# Patient Record
Sex: Male | Born: 1945 | Race: Black or African American | Hispanic: No | Marital: Married | Smoking: Never smoker
Health system: Southern US, Community
[De-identification: ages and names within clinical notes are randomized; demographics above are authoritative.]

## PROBLEM LIST (undated history)

## (undated) DIAGNOSIS — M109 Gout, unspecified: Secondary | ICD-10-CM

## (undated) DIAGNOSIS — E785 Hyperlipidemia, unspecified: Secondary | ICD-10-CM

## (undated) DIAGNOSIS — C61 Malignant neoplasm of prostate: Secondary | ICD-10-CM

## (undated) DIAGNOSIS — N19 Unspecified kidney failure: Secondary | ICD-10-CM

## (undated) DIAGNOSIS — I10 Essential (primary) hypertension: Secondary | ICD-10-CM

## (undated) HISTORY — PX: SHOULDER SURGERY: SHX246

## (undated) HISTORY — PX: PROSTATE BIOPSY: SHX241

## (undated) HISTORY — PX: KIDNEY TRANSPLANT: SHX239

---

## 1998-02-18 ENCOUNTER — Other Ambulatory Visit: Admission: RE | Admit: 1998-02-18 | Discharge: 1998-02-18 | Payer: Self-pay | Admitting: Family Medicine

## 1998-02-19 ENCOUNTER — Other Ambulatory Visit: Admission: RE | Admit: 1998-02-19 | Discharge: 1998-02-19 | Payer: Self-pay | Admitting: Family Medicine

## 1999-03-09 ENCOUNTER — Ambulatory Visit (HOSPITAL_BASED_OUTPATIENT_CLINIC_OR_DEPARTMENT_OTHER): Admission: RE | Admit: 1999-03-09 | Discharge: 1999-03-09 | Payer: Self-pay | Admitting: Ophthalmology

## 1999-11-20 ENCOUNTER — Other Ambulatory Visit: Admission: RE | Admit: 1999-11-20 | Discharge: 1999-11-20 | Payer: Self-pay | Admitting: Urology

## 2003-06-07 ENCOUNTER — Encounter: Payer: Self-pay | Admitting: Nephrology

## 2003-06-07 ENCOUNTER — Encounter: Admission: RE | Admit: 2003-06-07 | Discharge: 2003-06-07 | Payer: Self-pay | Admitting: Nephrology

## 2003-06-21 ENCOUNTER — Encounter: Payer: Self-pay | Admitting: Family Medicine

## 2003-06-21 ENCOUNTER — Ambulatory Visit (HOSPITAL_COMMUNITY): Admission: RE | Admit: 2003-06-21 | Discharge: 2003-06-21 | Payer: Self-pay | Admitting: Family Medicine

## 2005-08-27 ENCOUNTER — Encounter: Admission: RE | Admit: 2005-08-27 | Discharge: 2005-08-27 | Payer: Self-pay | Admitting: Nephrology

## 2005-11-18 ENCOUNTER — Ambulatory Visit (HOSPITAL_COMMUNITY): Admission: RE | Admit: 2005-11-18 | Discharge: 2005-11-18 | Payer: Self-pay | Admitting: Pulmonary Disease

## 2005-12-09 ENCOUNTER — Encounter: Admission: RE | Admit: 2005-12-09 | Discharge: 2005-12-09 | Payer: Self-pay | Admitting: Orthopedic Surgery

## 2006-01-06 ENCOUNTER — Ambulatory Visit (HOSPITAL_COMMUNITY): Admission: RE | Admit: 2006-01-06 | Discharge: 2006-01-07 | Payer: Self-pay | Admitting: Orthopedic Surgery

## 2006-01-26 ENCOUNTER — Encounter: Admission: RE | Admit: 2006-01-26 | Discharge: 2006-03-02 | Payer: Self-pay | Admitting: Orthopedic Surgery

## 2007-10-26 HISTORY — PX: VASECTOMY: SHX75

## 2008-04-30 ENCOUNTER — Ambulatory Visit (HOSPITAL_COMMUNITY): Admission: RE | Admit: 2008-04-30 | Discharge: 2008-04-30 | Payer: Self-pay | Admitting: Pulmonary Disease

## 2008-09-04 ENCOUNTER — Ambulatory Visit (HOSPITAL_COMMUNITY): Admission: RE | Admit: 2008-09-04 | Discharge: 2008-09-04 | Payer: Self-pay | Admitting: Urology

## 2008-10-10 ENCOUNTER — Ambulatory Visit (HOSPITAL_COMMUNITY): Admission: RE | Admit: 2008-10-10 | Discharge: 2008-10-10 | Payer: Self-pay | Admitting: Urology

## 2008-11-01 ENCOUNTER — Ambulatory Visit: Admission: RE | Admit: 2008-11-01 | Discharge: 2009-01-30 | Payer: Self-pay | Admitting: Radiation Oncology

## 2010-11-15 ENCOUNTER — Encounter: Payer: Self-pay | Admitting: Urology

## 2011-03-12 NOTE — Op Note (Signed)
NAME:  Wyatt Austin, Wyatt Austin NO.:  [id number]   MEDICAL RECORD NO.:  [id number]          PATIENT TYPE:  OIB   LOCATION:  5003                         FACILITY:  [facility]   PHYSICIAN:  Myrtie Neither, MD      DATE OF BIRTH:  [birth year]   DATE OF PROCEDURE:  01/06/2006  DATE OF DISCHARGE:  01/07/2006                                 OPERATIVE REPORT   PREOPERATIVE DIAGNOSIS:  Impingement syndrome and subacromial bursitis, left  shoulder.   POSTOPERATIVE DIAGNOSIS:  Impingement syndrome and subacromial bursitis,  left shoulder.   SURGEON:  Myrtie Neither, MD   ANESTHESIA:  General.   PROCEDURE:  Arthroscopic acromioplasty and decompression synovectomy, left  shoulder.   DESCRIPTION OF PROCEDURE:  The patient was taken to the operating room after  giving adequate preop medication and given general anesthesia and intubated.  The patient had a scalene block given.  The patient was placed in the barber-  chair position and left shoulder was prepped with DuraPrep and draped in a  sterile manner.  A 1/2-inch puncture wound was made posterior.  A Swisher  rod was placed posterior-to-anterior and anterior inflow of water.  Lateral  arthroscopic shaver incision was made.  Inspection of the joint revealed  tremendously hypertrophic subacromial bursa sac with obvious tendinopathy  against the rotator cuff and chondromalacic changes of the subacromial  surface.  The synovial sac was completely resected followed by acromioplasty  and decompression of the coracohumeral ligament with the use of the shaver.  The cuff tendon was found to have been impinged upon with obvious thickened  surface area with some fibers being penetrated, but not through and through.  After adequate decompression and debridement, wound closure was then done  with 4-0 nylon.  Compressive dressing was applied.  The patient was placed  in an immobilizing sling.  The patient is being kept for 23-hour observation  for  pain control.  The patient tolerated the procedure quite well and will  be discharged in a.m. on Percocet one to two q.4 h. p.r.n. for pain, ice  packs and continued use of immobilizing sling.   The patient will be seen back in the office in 1 week.      Myrtie Neither, MD  Electronically Signed     AC/MEDQ  D:  01/06/2006  T:  01/08/2006  Job:  [id number]

## 2014-05-10 ENCOUNTER — Other Ambulatory Visit: Payer: Self-pay | Admitting: Pulmonary Disease

## 2014-05-10 ENCOUNTER — Ambulatory Visit
Admission: RE | Admit: 2014-05-10 | Discharge: 2014-05-10 | Disposition: A | Discharge status: Home / Self Care | Payer: Medicare Other | Source: Ambulatory Visit | Attending: Pulmonary Disease | Admitting: Pulmonary Disease

## 2014-05-10 DIAGNOSIS — R0789 Other chest pain: Secondary | ICD-10-CM

## 2016-04-02 ENCOUNTER — Other Ambulatory Visit: Payer: Self-pay | Admitting: Nephrology

## 2016-04-02 DIAGNOSIS — Z940 Kidney transplant status: Secondary | ICD-10-CM

## 2016-04-09 ENCOUNTER — Ambulatory Visit
Admission: RE | Admit: 2016-04-09 | Discharge: 2016-04-09 | Disposition: A | Discharge status: Home / Self Care | Payer: Medicare Other | Source: Ambulatory Visit | Attending: Nephrology | Admitting: Nephrology

## 2016-04-09 ENCOUNTER — Other Ambulatory Visit: Payer: Self-pay | Admitting: Nephrology

## 2016-04-09 DIAGNOSIS — Z940 Kidney transplant status: Secondary | ICD-10-CM

## 2016-12-02 ENCOUNTER — Other Ambulatory Visit: Payer: Self-pay | Admitting: Urology

## 2016-12-02 DIAGNOSIS — C61 Malignant neoplasm of prostate: Secondary | ICD-10-CM

## 2016-12-14 ENCOUNTER — Encounter (HOSPITAL_COMMUNITY)
Admission: RE | Admit: 2016-12-14 | Discharge: 2016-12-14 | Disposition: A | Discharge status: Home / Self Care | Payer: Medicare Other | Source: Ambulatory Visit | Attending: Urology | Admitting: Urology

## 2016-12-14 DIAGNOSIS — C61 Malignant neoplasm of prostate: Secondary | ICD-10-CM | POA: Insufficient documentation

## 2016-12-14 MED ORDER — TECHNETIUM TC 99M MEDRONATE IV KIT
25.0000 | PACK | Freq: Once | INTRAVENOUS | Status: AC | PRN
  Administered 2016-12-14: 21.2 via INTRAVENOUS

## 2017-01-05 ENCOUNTER — Encounter: Payer: Self-pay | Admitting: Radiation Oncology

## 2017-01-14 ENCOUNTER — Encounter: Payer: Self-pay | Admitting: Radiation Oncology

## 2017-01-14 NOTE — Progress Notes (Signed)
GU Location of Tumor / Histology: prostatic adenocarcinoma  If Prostate Cancer, Gleason Score is (4 + 3) and PSA was (7.4) at time of diagnosis. Prostate volume: 63 cc.  12/31/16 PSA was 12.7.   Wyatt Austin was diagnosed with prostate cancer 11/2007 by Dr. Janice Norrie and opted for active surveillance.  Biopsies of prostate (if applicable) revealed:    Past/Anticipated interventions by urology, if any: biopsy, active surveillance, referral to radiation oncology. [name] plans to make a decision about [name] after radiation consult.  Past/Anticipated interventions by medical oncology, if any: no  Weight changes, if any: no  Bowel/Bladder complaints, if any: Reports ED. Reports urinary urgency once during the night. IPSS 3. Denies dysuria, hematuria or leakage. Describes a steady urine stream.  Nausea/Vomiting, if any: no  Pain issues, if any:  "only occasionally if my gout flares up."  SAFETY ISSUES:  Prior radiation? no  Pacemaker/ICD? no  Possible current pregnancy? no  Is the patient on methotrexate? no  Current Complaints / other details:  71 year old male. Married. NKDA.

## 2017-01-17 ENCOUNTER — Encounter: Payer: Self-pay | Admitting: Medical Oncology

## 2017-01-17 ENCOUNTER — Ambulatory Visit
Admission: RE | Admit: 2017-01-17 | Discharge: 2017-01-17 | Disposition: A | Discharge status: Home / Self Care | Payer: Medicare Other | Source: Ambulatory Visit | Attending: Radiation Oncology | Admitting: Radiation Oncology

## 2017-01-17 ENCOUNTER — Encounter: Payer: Self-pay | Admitting: Radiation Oncology

## 2017-01-17 VITALS — BP 145/85 | HR 56 | Temp 97.9°F | Resp 18 | Ht 65.0 in | Wt 183.8 lb

## 2017-01-17 DIAGNOSIS — I10 Essential (primary) hypertension: Secondary | ICD-10-CM | POA: Insufficient documentation

## 2017-01-17 DIAGNOSIS — M109 Gout, unspecified: Secondary | ICD-10-CM | POA: Diagnosis not present

## 2017-01-17 DIAGNOSIS — Z7982 Long term (current) use of aspirin: Secondary | ICD-10-CM | POA: Insufficient documentation

## 2017-01-17 DIAGNOSIS — C61 Malignant neoplasm of prostate: Secondary | ICD-10-CM

## 2017-01-17 DIAGNOSIS — E785 Hyperlipidemia, unspecified: Secondary | ICD-10-CM | POA: Diagnosis not present

## 2017-01-17 DIAGNOSIS — Z940 Kidney transplant status: Secondary | ICD-10-CM | POA: Insufficient documentation

## 2017-01-17 DIAGNOSIS — Z510 Encounter for antineoplastic radiation therapy: Secondary | ICD-10-CM | POA: Diagnosis present

## 2017-01-17 HISTORY — DX: Unspecified kidney failure: N19

## 2017-01-17 HISTORY — DX: Gout, unspecified: M10.9

## 2017-01-17 HISTORY — DX: Essential (primary) hypertension: I10

## 2017-01-17 HISTORY — DX: Hyperlipidemia, unspecified: E78.5

## 2017-01-17 HISTORY — DX: Malignant neoplasm of prostate: C61

## 2017-01-17 NOTE — Progress Notes (Signed)
See progress note under physician encounter. 

## 2017-01-17 NOTE — Progress Notes (Signed)
Radiation Oncology         [phone]) [phone] ________________________________  Initial outpatient Consultation  Name: Wyatt Austin MRN: [id number]  Date: 01/17/2017  DOB: 10-04-1946  DI:YMEBRAXENM Wyatt Cantor, MD  Austin, Wyatt Furbish, MD   REFERRING PHYSICIAN: [name], MD  DIAGNOSIS: 71 y.o. gentleman with stage T1c adenocarcinoma of the prostate with Gleason Score of 4+4, and PSA of 12.7    ICD-9-CM ICD-10-CM   1. Malignant neoplasm of prostate ([place]) 185 C61     HISTORY OF PRESENT ILLNESS: Wyatt Austin is a 71 y.o. male with a diagnosis of prostate cancer. Wyatt Austin was found to have prostate cancer in 2011. It appears he had a prostate biopsy on 07/13/2010 at which time his Gleason's Score was 3+3 in 2 of 12 cores. He decided on active surveillance and had subsequent biopsy in March 2014 when his PSA was 7.2 and Gleason's Score was 3+3 in 1 out of 12 cores. In June 2016, he had another biopsy, but his PSA is not known from that time. His biopsy revealed a 4+4 Gleason score and several other 4+3 cores. He elected to continue in surveillance.  He was seen by Dr. Noah Austin after PSA of 12.7 on 12/02/16 was noted. He did undergo a CT of the abdomen and pelvis and bone scan on 12/31/2016 both which were negative for metastatic disease. He is here today to discuss radiotherapy options in the management of his disease.      PREVIOUS RADIATION THERAPY: No  PAST MEDICAL HISTORY:  Past Medical History:  Diagnosis Date  . Gout   . Hyperlipidemia   . Hypertension   . Prostate cancer ([place])       PAST SURGICAL HISTORY: Past Surgical History:  Procedure Laterality Date  . KIDNEY TRANSPLANT    . PROSTATE BIOPSY    . SHOULDER SURGERY    . VASECTOMY  2009    FAMILY HISTORY:  Family History  Problem Relation Age of Onset  . Cancer Mother     "in her abdomen"  . Cancer Father     prostate "treatment unknown"    SOCIAL HISTORY:  Social History   Social History  .  Marital status: Married    Spouse name: N/A  . Number of children: N/A  . Years of education: N/A   Occupational History  . Not on file.   Social History Main Topics  . Smoking status: Never Smoker  . Smokeless tobacco: Never Used  . Alcohol use Yes     Comment: socially  . Drug use: No  . Sexual activity: Not on file   Other Topics Concern  . Not on file   Social History Narrative  . No narrative on file    ALLERGIES: Nifedipine  MEDICATIONS:  Current Outpatient Prescriptions  Medication Sig Dispense Refill  . acetaminophen (TYLENOL) 500 MG tablet Take 500 mg by mouth.    Marland Kitchen allopurinol (ZYLOPRIM) 100 MG tablet Take 100 mg by mouth.    . ALPRAZolam (XANAX) 0.25 MG tablet Take 0.25 mg by mouth.    Marland Kitchen amLODipine (NORVASC) 10 MG tablet Take 10 mg by mouth.    Marland Kitchen aspirin 81 MG chewable tablet Chew 81 mg by mouth.    . bismuth subsalicylate (PEPTO BISMOL) 262 MG chewable tablet Chew by mouth.    . diphenhydrAMINE (BENADRYL) 25 MG tablet Take 25 mg by mouth.    . doxazosin (CARDURA) 8 MG tablet Take 8 mg by mouth.    Marland Kitchen  finasteride (PROSCAR) 5 MG tablet Take 5 mg by mouth.    . furosemide (LASIX) 40 MG tablet Take 40 mg by mouth.    Marland Kitchen HYDROcodone-acetaminophen (NORCO/VICODIN) 5-325 MG tablet Take by mouth.    . magnesium oxide (MAG-OX) 400 MG tablet Take 400 mg by mouth.    . metoprolol (LOPRESSOR) 50 MG tablet Take 25 mg by mouth.    . Multiple Vitamin (MULTI-VITAMINS) TABS Take by mouth.    . mycophenolate (CELLCEPT) 250 MG capsule Take 500 mg by mouth.    . promethazine (PHENERGAN) 12.5 MG tablet Take 12.5 mg by mouth every 6 (six) hours as needed for nausea or vomiting.    . sildenafil (VIAGRA) 100 MG tablet Take 100 mg by mouth.    . simvastatin (ZOCOR) 40 MG tablet Take 40 mg by mouth.    . tacrolimus (PROGRAF) 1 MG capsule Take 3 mg by mouth.     No current facility-administered medications for this encounter.     REVIEW OF SYSTEMS:  On review of systems, the patient  reports that he is doing well overall. He denies any chest pain, shortness of breath, cough, fevers, chills, night sweats, unintended weight changes. He denies any bowel disturbances, and denies abdominal pain, nausea or vomiting. He denies any new musculoskeletal or joint aches or pains. He reports pain issues "only occasionally if my gout flares up." His IPSS was 3, indicating mild urinary symptoms including urinary urgency once during the night. He denies dysuria, hematuria, or leakage. He describes a steady urine stream. He reports erectile dysfunction. A complete review of systems is obtained and is otherwise negative.    PHYSICAL EXAM:  Wt Readings from Last 3 Encounters:  01/17/17 183 lb 12.8 oz (83.4 kg)   Temp Readings from Last 3 Encounters:  01/17/17 97.9 F (36.6 C) (Oral)   BP Readings from Last 3 Encounters:  01/17/17 (!) 145/85   Pulse Readings from Last 3 Encounters:  01/17/17 (!) 56   Pain Assessment Pain Score: 0-No pain/10  In general this is a well appearing African-American male in no acute distress. He is alert and oriented x4 and appropriate throughout the examination. HEENT reveals that the patient is normocephalic, atraumatic. EOMs are intact. PERRLA. Skin is intact without any evidence of gross lesions. Cardiovascular exam reveals a regular rate and rhythm, no clicks rubs or murmurs are auscultated. Chest is clear to auscultation bilaterally. Lymphatic assessment is performed and does not reveal any adenopathy in the cervical, supraclavicular, axillary, or inguinal chains. Abdomen has active bowel sounds in all quadrants and is intact. The abdomen is soft, non tender, non distended. Lower extremities are negative for pretibial pitting edema, deep calf tenderness, cyanosis or clubbing.   KPS = 90  100 - Normal; no complaints; no evidence of disease. 90   - Able to carry on normal activity; minor signs or symptoms of disease. 80   - Normal activity with effort; some  signs or symptoms of disease. 67   - Cares for self; unable to carry on normal activity or to do active work. 60   - Requires occasional assistance, but is able to care for most of his personal needs. 50   - Requires considerable assistance and frequent medical care. 5   - Disabled; requires special care and assistance. 18   - Severely disabled; hospital admission is indicated although death not imminent. 79   - Very sick; hospital admission necessary; active supportive treatment necessary. 10   - Moribund;  fatal processes progressing rapidly. 0     - Dead  Karnofsky DA, Abelmann WH, Craver LS and Burchenal JH [phone]) The use of the nitrogen mustards in the palliative treatment of carcinoma: with particular reference to bronchogenic carcinoma Cancer 1 634-56  LABORATORY DATA:  No results found for: WBC, HGB, HCT, MCV, PLT No results found for: NA, K, CL, CO2 No results found for: ALT, AST, GGT, ALKPHOS, BILITOT   RADIOGRAPHY: No results found.    IMPRESSION/PLAN: 1. 71 y.o. gentleman with high risk adenocarcinoma of the prostate with Gleason Score of 4+4, and PSA of 12.7.  We discussed the natural history of prostate cancer and reviewed his previous course including biopsies and PSA values.  We reviewed the the implications of T-stage, Gleason's Score, and PSA on decision-making and outcomes in prostate cancer.  We discussed radiation treatment in the management of prostate cancer with regard to the logistics and delivery of external beam radiation treatment with hormone therapy as well as the logistics and delivery of prostate brachytherapy with external beam radiation and hormone therapy.   The patient expressed interested in beginning hormone therapy and would like to proceed with external radiotherapy as well as prostate seed implant as a boost if he is a good candidate for the latter. His IPSS score is appropriate, but his gland size will determine if he is still a candidate at the time of  his fiducial marker placement. I've asked if he could have fiducial marker placement at the time of his firmagon appointment on 02/10/17 so we can plan for either 8 weeks of IMRT with ADT, versus 5 weeks of IMRT with ADT in addition to radioactive seed implant. We also discussed the use of SpaceOAR gel if he elected for seed implant as well.    Carola Rhine, PAC  &    Tyler Pita, MD   This document serves as a record of services personally performed by Tyler Pita, MD and Shona Simpson, PA-C. It was created on their behalf by Arlyce Harman, a trained medical scribe. The creation of this record is based on the scribe's personal observations and the provider's statements to them. This document has been checked and approved by the attending provider.

## 2017-02-10 ENCOUNTER — Telehealth: Payer: Self-pay | Admitting: *Deleted

## 2017-02-10 NOTE — Telephone Encounter (Signed)
CALLED PATIENT TO INFORM OF SIM APPT. ON 02-17-17 @ 10 AM @ DR. MANNING'S OFFICE, (GOLD SEEDS PLACED ON 02-10-17 @ [facility]), LVM FOR A RETURN CALL

## 2017-02-11 ENCOUNTER — Telehealth: Payer: Self-pay | Admitting: *Deleted

## 2017-02-11 NOTE — Telephone Encounter (Signed)
CALLED PATIENT TO INFORM OF SIM APPT. ON 02-17-17 @ 10 AM @ DR. MANNING'S OFFICE, SPOKE WITH PATIENT AND HE IS AWARE OF THIS APPT.

## 2017-02-15 ENCOUNTER — Telehealth: Payer: Self-pay | Admitting: Radiation Oncology

## 2017-02-15 ENCOUNTER — Telehealth: Payer: Self-pay | Admitting: *Deleted

## 2017-02-15 NOTE — Telephone Encounter (Signed)
I called to check on the patient. He has a diagnosis of high risk prostate cancer and received Firmagon on 02/10/17. He also had fiducial markers placed that day and his gland size was 75.8 cc. He was given a sim appt for 4/26 however this is too soon. I called and explained this to him, and let him know that we would postpone this sim appt until the week of 04/09/17, as it takes 2 months for the firmagon to help be more of a radiosensitizer. We will reschedule his simulation appointment. He is not a candidate at this time for seed implant as a boost, however if he was still motivated to proceed with this, he could be have one more ultrasound 3-4 months out to reassess his gland size. At this point, he's not interested in this approach and will rather plan for 8 weeks of external radiotherapy when medically appropriate.

## 2017-02-15 NOTE — Telephone Encounter (Signed)
CALLED PATIENT TO INFORM OF SIM APPT. BEING MOVED FROM 02-17-17 TO 04-15-17 @ 1 PM, SPOKE WITH PATIENT AND HE IS AWARE OF THIS NEW APPT.

## 2017-02-17 ENCOUNTER — Ambulatory Visit: Payer: Medicare Other | Admitting: Radiation Oncology

## 2017-03-14 ENCOUNTER — Encounter (HOSPITAL_COMMUNITY): Payer: Self-pay

## 2017-03-14 ENCOUNTER — Emergency Department (HOSPITAL_COMMUNITY)
Admission: EM | Admit: 2017-03-14 | Discharge: 2017-03-15 | Disposition: A | Discharge status: Home / Self Care | Payer: Medicare Other | Attending: Emergency Medicine | Admitting: Emergency Medicine

## 2017-03-14 DIAGNOSIS — I10 Essential (primary) hypertension: Secondary | ICD-10-CM | POA: Diagnosis not present

## 2017-03-14 DIAGNOSIS — Z79899 Other long term (current) drug therapy: Secondary | ICD-10-CM | POA: Diagnosis not present

## 2017-03-14 DIAGNOSIS — Z7982 Long term (current) use of aspirin: Secondary | ICD-10-CM | POA: Insufficient documentation

## 2017-03-14 DIAGNOSIS — Z8546 Personal history of malignant neoplasm of prostate: Secondary | ICD-10-CM | POA: Insufficient documentation

## 2017-03-14 DIAGNOSIS — K649 Unspecified hemorrhoids: Secondary | ICD-10-CM

## 2017-03-14 DIAGNOSIS — K6289 Other specified diseases of anus and rectum: Secondary | ICD-10-CM | POA: Diagnosis present

## 2017-03-14 NOTE — ED Triage Notes (Signed)
Pt states that for the past week he has been having rectal pain. Pt states that he saw his urologist today who looked at it and today him it was a hernia or hemorrhoids. Denies abd pain, denies rectal bleeding

## 2017-03-15 MED ORDER — HYDROCODONE-ACETAMINOPHEN 5-325 MG PO TABS: 2.0000 | ORAL_TABLET | ORAL | 0 refills | Status: AC | PRN

## 2017-03-15 MED ORDER — HYDROCODONE-ACETAMINOPHEN 5-325 MG PO TABS
2.0000 | ORAL_TABLET | Freq: Once | ORAL | Status: AC
  Administered 2017-03-15: 2 via ORAL
  Filled 2017-03-15: qty 2

## 2017-03-15 MED ORDER — DOCUSATE SODIUM 100 MG PO CAPS: 100.0000 mg | ORAL_CAPSULE | Freq: Two times a day (BID) | ORAL | 0 refills | Status: AC

## 2017-03-15 NOTE — ED Provider Notes (Signed)
[facility] DEPT Provider Note   CSN: [id number] Arrival date & time: 03/14/17  2103     History   Chief Complaint Chief Complaint  Patient presents with  . Rectal Pain    HPI Wyatt Austin is a 71 y.o. male.  Pt complains of hemorrhoid.   Pt reports pain in return for 3 days.  Pt reports his urologist gave him a prescription for protofoam.   Pt complains of discomfort.  Pt reports his doctor did not give him anything for pain   The history is provided by the patient. No language interpreter was used.    Past Medical History:  Diagnosis Date  . Gout   . Hyperlipidemia   . Hypertension   . Prostate cancer ([place])   . 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    Patient Active Problem List   Diagnosis Date Noted  . Malignant neoplasm of prostate ([place]) 01/17/2017    Past Surgical History:  Procedure Laterality Date  . KIDNEY TRANSPLANT    . PROSTATE BIOPSY    . SHOULDER SURGERY    . VASECTOMY  2009       Home Medications    Prior to Admission medications   Medication Sig Start Date End Date Taking? Authorizing Provider  acetaminophen (TYLENOL) 500 MG tablet Take 500 mg by mouth.    [provider]  allopurinol (ZYLOPRIM) 100 MG tablet Take 100 mg by mouth.    [provider]  ALPRAZolam Duanne Moron) 0.25 MG tablet Take 0.25 mg by mouth.    [provider]  amLODipine (NORVASC) 10 MG tablet Take 10 mg by mouth.    [provider]  aspirin 81 MG chewable tablet Chew 81 mg by mouth.    [provider]  bismuth subsalicylate (PEPTO BISMOL) 262 MG chewable tablet Chew by mouth.    [provider]  diphenhydrAMINE (BENADRYL) 25 MG tablet Take 25 mg by mouth.    [provider]  doxazosin (CARDURA) 8 MG tablet Take 8 mg by mouth.    [provider]  finasteride (PROSCAR) 5 MG tablet Take 5 mg by mouth.    [provider]  furosemide  (LASIX) 40 MG tablet Take 40 mg by mouth.    [provider]  HYDROcodone-acetaminophen (NORCO/VICODIN) 5-325 MG tablet Take by mouth.    [provider]  magnesium oxide (MAG-OX) 400 MG tablet Take 400 mg by mouth.    [provider]  metoprolol (LOPRESSOR) 50 MG tablet Take 25 mg by mouth.    [provider]  Multiple Vitamin (MULTI-VITAMINS) TABS Take by mouth.    [provider]  mycophenolate (CELLCEPT) 250 MG capsule Take 500 mg by mouth.    [provider]  promethazine (PHENERGAN) 12.5 MG tablet Take 12.5 mg by mouth every 6 (six) hours as needed for nausea or vomiting.    [provider]  sildenafil (VIAGRA) 100 MG tablet Take 100 mg by mouth.    [provider]  simvastatin (ZOCOR) 40 MG tablet Take 40 mg by mouth.    [provider]  tacrolimus (PROGRAF) 1 MG capsule Take 3 mg by mouth.    [provider]    Family History Family History  Problem Relation Age of Onset  . Cancer Mother        "in her abdomen"  . Cancer Father        prostate "treatment  unknown"    Social History Social History  Substance Use Topics  . Smoking status: Never Smoker  . Smokeless tobacco: Never Used  . Alcohol use Yes     Comment: socially     Allergies   Nifedipine   Review of Systems Review of Systems  All other systems reviewed and are negative.    Physical Exam Updated Vital Signs BP 119/73 (BP Location: Right Arm)   Pulse (!) 51   Temp 98.3 F (36.8 C) (Oral)   Resp 19   SpO2 98%   Physical Exam  Constitutional: He appears well-developed and well-nourished.  HENT:  Head: Normocephalic.  Eyes: Pupils are equal, round, and reactive to light.  Cardiovascular: Normal rate.   Abdominal: Soft.  Genitourinary:  Genitourinary Comments: 1.5 cm swollen area rectum,  Appears to be hemorrhoid,  Tender to palpation  Neurological: He is alert.  Skin: Skin is warm.  Psychiatric: He has a  normal mood and affect.  Nursing note and vitals reviewed.    ED Treatments / Results  Labs (all labs ordered are listed, but only abnormal results are displayed) Labs Reviewed - No data to display  EKG  EKG Interpretation None       Radiology No results found.  Procedures Procedures (including critical care time)  Medications Ordered in ED Medications  HYDROcodone-acetaminophen (NORCO/VICODIN) 5-325 MG per tablet 2 tablet (not administered)     Initial Impression / Assessment and Plan / ED Course  I have reviewed the triage vital signs and the nursing notes.  Pertinent labs & imaging results that were available during my care of the patient were reviewed by me and considered in my medical decision making (see chart for details).     Pt advised to continue proctofoam,  Pt given 2 hydrocodone here.  Rx for hydrocodone  Final Clinical Impressions(s) / ED Diagnoses   Final diagnoses:  Hemorrhoids, unspecified hemorrhoid type    New Prescriptions New Prescriptions   DOCUSATE SODIUM (COLACE) 100 MG CAPSULE    Take 1 capsule (100 mg total) by mouth every 12 (twelve) hours.   HYDROCODONE-ACETAMINOPHEN (NORCO/VICODIN) 5-325 MG TABLET    Take 2 tablets by mouth every 4 (four) hours as needed.     Fransico Meadow, Hershal Coria 03/15/17 0026    Quintella Reichert, MD 03/18/17 [phone]

## 2017-03-15 NOTE — ED Notes (Signed)
Pt verbalized understanding of d/c instructions and has no further questions. Pt is stable, A&Ox4, VSS.  

## 2017-04-15 ENCOUNTER — Ambulatory Visit
Admission: RE | Admit: 2017-04-15 | Discharge: 2017-04-15 | Disposition: A | Discharge status: Home / Self Care | Payer: Medicare Other | Source: Ambulatory Visit | Attending: Radiation Oncology | Admitting: Radiation Oncology

## 2017-04-15 DIAGNOSIS — Z510 Encounter for antineoplastic radiation therapy: Secondary | ICD-10-CM | POA: Insufficient documentation

## 2017-04-15 DIAGNOSIS — C61 Malignant neoplasm of prostate: Secondary | ICD-10-CM | POA: Insufficient documentation

## 2017-04-15 NOTE — Progress Notes (Signed)
  Radiation Oncology         [phone] ________________________________  Name: Wyatt Austin MRN: [id number]  Date: 04/15/2017  DOB: 09-Nov-1945  SIMULATION AND TREATMENT PLANNING NOTE    ICD-10-CM   1. Malignant neoplasm of prostate ([place]) C61     DIAGNOSIS:  71 y.o. gentleman with stage T1c adenocarcinoma of the prostate with Gleason Score of 4+4, and PSA of 12.7  NARRATIVE:  The patient was brought to the [facility].  Identity was confirmed.  All relevant records and images related to the planned course of therapy were reviewed.  The patient freely provided informed written consent to proceed with treatment after reviewing the details related to the planned course of therapy. The consent form was witnessed and verified by the simulation staff.  Then, the patient was set-up in a stable reproducible supine position for radiation therapy.  A vacuum lock pillow device was custom fabricated to position his legs in a reproducible immobilized position.  Then, I performed a urethrogram under sterile conditions to identify the prostatic apex.  CT images were obtained.  Surface markings were placed.  The CT images were loaded into the planning software.  Then the prostate target and avoidance structures including the rectum, bladder, bowel and hips were contoured.  Treatment planning then occurred.  The radiation prescription was entered and confirmed.  A total of one complex treatment devices was fabricated. I have requested : Intensity Modulated Radiotherapy (IMRT) is medically necessary for this case for the following reason:  Rectal sparing.Marland Kitchen  PLAN:  The patient will receive 78 Gy in 40 fractions.  ________________________________  Sheral Apley Tammi Klippel, M.D.

## 2017-04-20 DIAGNOSIS — C61 Malignant neoplasm of prostate: Secondary | ICD-10-CM | POA: Diagnosis not present

## 2017-04-20 DIAGNOSIS — Z510 Encounter for antineoplastic radiation therapy: Secondary | ICD-10-CM | POA: Diagnosis present

## 2017-04-28 ENCOUNTER — Encounter: Payer: Self-pay | Admitting: Medical Oncology

## 2017-04-28 ENCOUNTER — Ambulatory Visit
Admission: RE | Admit: 2017-04-28 | Discharge: 2017-04-28 | Disposition: A | Discharge status: Home / Self Care | Payer: Medicare Other | Source: Ambulatory Visit | Attending: Radiation Oncology | Admitting: Radiation Oncology

## 2017-04-28 DIAGNOSIS — Z510 Encounter for antineoplastic radiation therapy: Secondary | ICD-10-CM | POA: Diagnosis not present

## 2017-04-28 NOTE — Progress Notes (Signed)
Wyatt Austin states he is ready to begin radiation. He received [name] 02/10/17 which has given him a "few" hot flashes and some fatigue. He states the abdominal pain after the injections was the worst. We discussed the radiation process and what to expect. I will continue to follow and asked him to call with questions or concerns.

## 2017-04-29 ENCOUNTER — Ambulatory Visit
Admission: RE | Admit: 2017-04-29 | Discharge: 2017-04-29 | Disposition: A | Discharge status: Home / Self Care | Payer: Medicare Other | Source: Ambulatory Visit | Attending: Radiation Oncology | Admitting: Radiation Oncology

## 2017-04-29 DIAGNOSIS — Z510 Encounter for antineoplastic radiation therapy: Secondary | ICD-10-CM | POA: Diagnosis not present

## 2017-05-02 ENCOUNTER — Ambulatory Visit
Admission: RE | Admit: 2017-05-02 | Discharge: 2017-05-02 | Disposition: A | Discharge status: Home / Self Care | Payer: Medicare Other | Source: Ambulatory Visit | Attending: Radiation Oncology | Admitting: Radiation Oncology

## 2017-05-02 DIAGNOSIS — Z510 Encounter for antineoplastic radiation therapy: Secondary | ICD-10-CM | POA: Diagnosis not present

## 2017-05-03 ENCOUNTER — Ambulatory Visit
Admission: RE | Admit: 2017-05-03 | Discharge: 2017-05-03 | Disposition: A | Discharge status: Home / Self Care | Payer: Medicare Other | Source: Ambulatory Visit | Attending: Radiation Oncology | Admitting: Radiation Oncology

## 2017-05-03 DIAGNOSIS — Z510 Encounter for antineoplastic radiation therapy: Secondary | ICD-10-CM | POA: Diagnosis not present

## 2017-05-04 ENCOUNTER — Ambulatory Visit: Payer: Medicare Other

## 2017-05-05 ENCOUNTER — Ambulatory Visit: Payer: Medicare Other

## 2017-05-06 ENCOUNTER — Ambulatory Visit: Payer: Medicare Other

## 2017-05-09 ENCOUNTER — Ambulatory Visit
Admission: RE | Admit: 2017-05-09 | Discharge: 2017-05-09 | Disposition: A | Discharge status: Home / Self Care | Payer: Medicare Other | Source: Ambulatory Visit | Attending: Radiation Oncology | Admitting: Radiation Oncology

## 2017-05-09 DIAGNOSIS — Z510 Encounter for antineoplastic radiation therapy: Secondary | ICD-10-CM | POA: Diagnosis not present

## 2017-05-10 ENCOUNTER — Ambulatory Visit
Admission: RE | Admit: 2017-05-10 | Discharge: 2017-05-10 | Disposition: A | Discharge status: Home / Self Care | Payer: Medicare Other | Source: Ambulatory Visit | Attending: Radiation Oncology | Admitting: Radiation Oncology

## 2017-05-10 DIAGNOSIS — Z510 Encounter for antineoplastic radiation therapy: Secondary | ICD-10-CM | POA: Diagnosis not present

## 2017-05-11 ENCOUNTER — Ambulatory Visit
Admission: RE | Admit: 2017-05-11 | Discharge: 2017-05-11 | Disposition: A | Discharge status: Home / Self Care | Payer: Medicare Other | Source: Ambulatory Visit | Attending: Radiation Oncology | Admitting: Radiation Oncology

## 2017-05-11 DIAGNOSIS — Z510 Encounter for antineoplastic radiation therapy: Secondary | ICD-10-CM | POA: Diagnosis not present

## 2017-05-12 ENCOUNTER — Ambulatory Visit
Admission: RE | Admit: 2017-05-12 | Discharge: 2017-05-12 | Disposition: A | Discharge status: Home / Self Care | Payer: Medicare Other | Source: Ambulatory Visit | Attending: Radiation Oncology | Admitting: Radiation Oncology

## 2017-05-12 DIAGNOSIS — Z510 Encounter for antineoplastic radiation therapy: Secondary | ICD-10-CM | POA: Diagnosis not present

## 2017-05-13 ENCOUNTER — Ambulatory Visit
Admission: RE | Admit: 2017-05-13 | Discharge: 2017-05-13 | Disposition: A | Discharge status: Home / Self Care | Payer: Medicare Other | Source: Ambulatory Visit | Attending: Radiation Oncology | Admitting: Radiation Oncology

## 2017-05-13 DIAGNOSIS — Z510 Encounter for antineoplastic radiation therapy: Secondary | ICD-10-CM | POA: Diagnosis not present

## 2017-05-16 ENCOUNTER — Ambulatory Visit
Admission: RE | Admit: 2017-05-16 | Discharge: 2017-05-16 | Disposition: A | Discharge status: Home / Self Care | Payer: Medicare Other | Source: Ambulatory Visit | Attending: Radiation Oncology | Admitting: Radiation Oncology

## 2017-05-16 DIAGNOSIS — Z510 Encounter for antineoplastic radiation therapy: Secondary | ICD-10-CM | POA: Diagnosis not present

## 2017-05-17 ENCOUNTER — Ambulatory Visit
Admission: RE | Admit: 2017-05-17 | Discharge: 2017-05-17 | Disposition: A | Discharge status: Home / Self Care | Payer: Medicare Other | Source: Ambulatory Visit | Attending: Radiation Oncology | Admitting: Radiation Oncology

## 2017-05-17 ENCOUNTER — Encounter: Payer: Self-pay | Admitting: Medical Oncology

## 2017-05-17 DIAGNOSIS — Z510 Encounter for antineoplastic radiation therapy: Secondary | ICD-10-CM | POA: Diagnosis not present

## 2017-05-18 ENCOUNTER — Ambulatory Visit
Admission: RE | Admit: 2017-05-18 | Discharge: 2017-05-18 | Disposition: A | Discharge status: Home / Self Care | Payer: Medicare Other | Source: Ambulatory Visit | Attending: Radiation Oncology | Admitting: Radiation Oncology

## 2017-05-18 DIAGNOSIS — Z510 Encounter for antineoplastic radiation therapy: Secondary | ICD-10-CM | POA: Diagnosis not present

## 2017-05-19 ENCOUNTER — Ambulatory Visit
Admission: RE | Admit: 2017-05-19 | Discharge: 2017-05-19 | Disposition: A | Discharge status: Home / Self Care | Payer: Medicare Other | Source: Ambulatory Visit | Attending: Radiation Oncology | Admitting: Radiation Oncology

## 2017-05-19 DIAGNOSIS — Z510 Encounter for antineoplastic radiation therapy: Secondary | ICD-10-CM | POA: Diagnosis not present

## 2017-05-20 ENCOUNTER — Ambulatory Visit
Admission: RE | Admit: 2017-05-20 | Discharge: 2017-05-20 | Disposition: A | Discharge status: Home / Self Care | Payer: Medicare Other | Source: Ambulatory Visit | Attending: Radiation Oncology | Admitting: Radiation Oncology

## 2017-05-20 DIAGNOSIS — Z510 Encounter for antineoplastic radiation therapy: Secondary | ICD-10-CM | POA: Diagnosis not present

## 2017-05-23 ENCOUNTER — Ambulatory Visit
Admission: RE | Admit: 2017-05-23 | Discharge: 2017-05-23 | Disposition: A | Discharge status: Home / Self Care | Payer: Medicare Other | Source: Ambulatory Visit | Attending: Radiation Oncology | Admitting: Radiation Oncology

## 2017-05-23 DIAGNOSIS — Z510 Encounter for antineoplastic radiation therapy: Secondary | ICD-10-CM | POA: Diagnosis not present

## 2017-05-24 ENCOUNTER — Ambulatory Visit
Admission: RE | Admit: 2017-05-24 | Discharge: 2017-05-24 | Disposition: A | Discharge status: Home / Self Care | Payer: Medicare Other | Source: Ambulatory Visit | Attending: Radiation Oncology | Admitting: Radiation Oncology

## 2017-05-24 DIAGNOSIS — Z510 Encounter for antineoplastic radiation therapy: Secondary | ICD-10-CM | POA: Diagnosis not present

## 2017-05-25 ENCOUNTER — Ambulatory Visit
Admission: RE | Admit: 2017-05-25 | Discharge: 2017-05-25 | Disposition: A | Discharge status: Home / Self Care | Payer: Medicare Other | Source: Ambulatory Visit | Attending: Radiation Oncology | Admitting: Radiation Oncology

## 2017-05-25 DIAGNOSIS — Z510 Encounter for antineoplastic radiation therapy: Secondary | ICD-10-CM | POA: Diagnosis not present

## 2017-05-26 ENCOUNTER — Ambulatory Visit
Admission: RE | Admit: 2017-05-26 | Discharge: 2017-05-26 | Disposition: A | Discharge status: Home / Self Care | Payer: Medicare Other | Source: Ambulatory Visit | Attending: Radiation Oncology | Admitting: Radiation Oncology

## 2017-05-26 DIAGNOSIS — Z510 Encounter for antineoplastic radiation therapy: Secondary | ICD-10-CM | POA: Diagnosis not present

## 2017-05-27 ENCOUNTER — Ambulatory Visit
Admission: RE | Admit: 2017-05-27 | Discharge: 2017-05-27 | Disposition: A | Discharge status: Home / Self Care | Payer: Medicare Other | Source: Ambulatory Visit | Attending: Radiation Oncology | Admitting: Radiation Oncology

## 2017-05-27 DIAGNOSIS — Z510 Encounter for antineoplastic radiation therapy: Secondary | ICD-10-CM | POA: Diagnosis not present

## 2017-05-30 ENCOUNTER — Ambulatory Visit
Admission: RE | Admit: 2017-05-30 | Discharge: 2017-05-30 | Disposition: A | Discharge status: Home / Self Care | Payer: Medicare Other | Source: Ambulatory Visit | Attending: Radiation Oncology | Admitting: Radiation Oncology

## 2017-05-30 DIAGNOSIS — Z510 Encounter for antineoplastic radiation therapy: Secondary | ICD-10-CM | POA: Diagnosis not present

## 2017-05-31 ENCOUNTER — Ambulatory Visit
Admission: RE | Admit: 2017-05-31 | Discharge: 2017-05-31 | Disposition: A | Discharge status: Home / Self Care | Payer: Medicare Other | Source: Ambulatory Visit | Attending: Radiation Oncology | Admitting: Radiation Oncology

## 2017-05-31 DIAGNOSIS — Z510 Encounter for antineoplastic radiation therapy: Secondary | ICD-10-CM | POA: Diagnosis not present

## 2017-06-01 ENCOUNTER — Ambulatory Visit
Admission: RE | Admit: 2017-06-01 | Discharge: 2017-06-01 | Disposition: A | Discharge status: Home / Self Care | Payer: Medicare Other | Source: Ambulatory Visit | Attending: Radiation Oncology | Admitting: Radiation Oncology

## 2017-06-01 DIAGNOSIS — Z510 Encounter for antineoplastic radiation therapy: Secondary | ICD-10-CM | POA: Diagnosis not present

## 2017-06-02 ENCOUNTER — Ambulatory Visit
Admission: RE | Admit: 2017-06-02 | Discharge: 2017-06-02 | Disposition: A | Discharge status: Home / Self Care | Payer: Medicare Other | Source: Ambulatory Visit | Attending: Radiation Oncology | Admitting: Radiation Oncology

## 2017-06-02 DIAGNOSIS — Z510 Encounter for antineoplastic radiation therapy: Secondary | ICD-10-CM | POA: Diagnosis not present

## 2017-06-03 ENCOUNTER — Ambulatory Visit
Admission: RE | Admit: 2017-06-03 | Discharge: 2017-06-03 | Disposition: A | Discharge status: Home / Self Care | Payer: Medicare Other | Source: Ambulatory Visit | Attending: Radiation Oncology | Admitting: Radiation Oncology

## 2017-06-03 DIAGNOSIS — Z510 Encounter for antineoplastic radiation therapy: Secondary | ICD-10-CM | POA: Diagnosis not present

## 2017-06-06 ENCOUNTER — Ambulatory Visit
Admission: RE | Admit: 2017-06-06 | Discharge: 2017-06-06 | Disposition: A | Discharge status: Home / Self Care | Payer: Medicare Other | Source: Ambulatory Visit | Attending: Radiation Oncology | Admitting: Radiation Oncology

## 2017-06-06 DIAGNOSIS — Z510 Encounter for antineoplastic radiation therapy: Secondary | ICD-10-CM | POA: Diagnosis not present

## 2017-06-07 ENCOUNTER — Ambulatory Visit
Admission: RE | Admit: 2017-06-07 | Discharge: 2017-06-07 | Disposition: A | Discharge status: Home / Self Care | Payer: Medicare Other | Source: Ambulatory Visit | Attending: Radiation Oncology | Admitting: Radiation Oncology

## 2017-06-07 DIAGNOSIS — Z510 Encounter for antineoplastic radiation therapy: Secondary | ICD-10-CM | POA: Diagnosis not present

## 2017-06-08 ENCOUNTER — Ambulatory Visit
Admission: RE | Admit: 2017-06-08 | Discharge: 2017-06-08 | Disposition: A | Discharge status: Home / Self Care | Payer: Medicare Other | Source: Ambulatory Visit | Attending: Radiation Oncology | Admitting: Radiation Oncology

## 2017-06-08 DIAGNOSIS — Z510 Encounter for antineoplastic radiation therapy: Secondary | ICD-10-CM | POA: Diagnosis not present

## 2017-06-09 ENCOUNTER — Ambulatory Visit
Admission: RE | Admit: 2017-06-09 | Discharge: 2017-06-09 | Disposition: A | Discharge status: Home / Self Care | Payer: Medicare Other | Source: Ambulatory Visit | Attending: Radiation Oncology | Admitting: Radiation Oncology

## 2017-06-09 DIAGNOSIS — Z510 Encounter for antineoplastic radiation therapy: Secondary | ICD-10-CM | POA: Diagnosis not present

## 2017-06-10 ENCOUNTER — Ambulatory Visit
Admission: RE | Admit: 2017-06-10 | Discharge: 2017-06-10 | Disposition: A | Discharge status: Home / Self Care | Payer: Medicare Other | Source: Ambulatory Visit | Attending: Radiation Oncology | Admitting: Radiation Oncology

## 2017-06-10 DIAGNOSIS — Z510 Encounter for antineoplastic radiation therapy: Secondary | ICD-10-CM | POA: Diagnosis not present

## 2017-06-13 ENCOUNTER — Ambulatory Visit
Admission: RE | Admit: 2017-06-13 | Discharge: 2017-06-13 | Disposition: A | Discharge status: Home / Self Care | Payer: Medicare Other | Source: Ambulatory Visit | Attending: Radiation Oncology | Admitting: Radiation Oncology

## 2017-06-13 DIAGNOSIS — Z510 Encounter for antineoplastic radiation therapy: Secondary | ICD-10-CM | POA: Diagnosis not present

## 2017-06-14 ENCOUNTER — Ambulatory Visit
Admission: RE | Admit: 2017-06-14 | Discharge: 2017-06-14 | Disposition: A | Discharge status: Home / Self Care | Payer: Medicare Other | Source: Ambulatory Visit | Attending: Radiation Oncology | Admitting: Radiation Oncology

## 2017-06-14 DIAGNOSIS — Z510 Encounter for antineoplastic radiation therapy: Secondary | ICD-10-CM | POA: Diagnosis not present

## 2017-06-15 ENCOUNTER — Ambulatory Visit
Admission: RE | Admit: 2017-06-15 | Discharge: 2017-06-15 | Disposition: A | Discharge status: Home / Self Care | Payer: Medicare Other | Source: Ambulatory Visit | Attending: Radiation Oncology | Admitting: Radiation Oncology

## 2017-06-15 DIAGNOSIS — Z510 Encounter for antineoplastic radiation therapy: Secondary | ICD-10-CM | POA: Diagnosis not present

## 2017-06-16 ENCOUNTER — Ambulatory Visit
Admission: RE | Admit: 2017-06-16 | Discharge: 2017-06-16 | Disposition: A | Discharge status: Home / Self Care | Payer: Medicare Other | Source: Ambulatory Visit | Attending: Radiation Oncology | Admitting: Radiation Oncology

## 2017-06-16 DIAGNOSIS — Z510 Encounter for antineoplastic radiation therapy: Secondary | ICD-10-CM | POA: Diagnosis not present

## 2017-06-17 ENCOUNTER — Ambulatory Visit
Admission: RE | Admit: 2017-06-17 | Discharge: 2017-06-17 | Disposition: A | Discharge status: Home / Self Care | Payer: Medicare Other | Source: Ambulatory Visit | Attending: Radiation Oncology | Admitting: Radiation Oncology

## 2017-06-17 DIAGNOSIS — Z510 Encounter for antineoplastic radiation therapy: Secondary | ICD-10-CM | POA: Diagnosis not present

## 2017-06-20 ENCOUNTER — Ambulatory Visit
Admission: RE | Admit: 2017-06-20 | Discharge: 2017-06-20 | Disposition: A | Discharge status: Home / Self Care | Payer: Medicare Other | Source: Ambulatory Visit | Attending: Radiation Oncology | Admitting: Radiation Oncology

## 2017-06-20 DIAGNOSIS — Z510 Encounter for antineoplastic radiation therapy: Secondary | ICD-10-CM | POA: Diagnosis not present

## 2017-06-21 ENCOUNTER — Ambulatory Visit
Admission: RE | Admit: 2017-06-21 | Discharge: 2017-06-21 | Disposition: A | Discharge status: Home / Self Care | Payer: Medicare Other | Source: Ambulatory Visit | Attending: Radiation Oncology | Admitting: Radiation Oncology

## 2017-06-21 DIAGNOSIS — Z510 Encounter for antineoplastic radiation therapy: Secondary | ICD-10-CM | POA: Diagnosis not present

## 2017-06-22 ENCOUNTER — Ambulatory Visit
Admission: RE | Admit: 2017-06-22 | Discharge: 2017-06-22 | Disposition: A | Discharge status: Home / Self Care | Payer: Medicare Other | Source: Ambulatory Visit | Attending: Radiation Oncology | Admitting: Radiation Oncology

## 2017-06-22 ENCOUNTER — Ambulatory Visit: Payer: Medicare Other

## 2017-06-22 DIAGNOSIS — Z510 Encounter for antineoplastic radiation therapy: Secondary | ICD-10-CM | POA: Diagnosis not present

## 2017-06-23 ENCOUNTER — Ambulatory Visit
Admission: RE | Admit: 2017-06-23 | Discharge: 2017-06-23 | Disposition: A | Discharge status: Home / Self Care | Payer: Medicare Other | Source: Ambulatory Visit | Attending: Radiation Oncology | Admitting: Radiation Oncology

## 2017-06-23 DIAGNOSIS — Z510 Encounter for antineoplastic radiation therapy: Secondary | ICD-10-CM | POA: Diagnosis not present

## 2017-06-24 ENCOUNTER — Ambulatory Visit
Admission: RE | Admit: 2017-06-24 | Discharge: 2017-06-24 | Disposition: A | Discharge status: Home / Self Care | Payer: Medicare Other | Source: Ambulatory Visit | Attending: Radiation Oncology | Admitting: Radiation Oncology

## 2017-06-24 DIAGNOSIS — Z510 Encounter for antineoplastic radiation therapy: Secondary | ICD-10-CM | POA: Diagnosis not present

## 2017-06-27 ENCOUNTER — Ambulatory Visit: Payer: Medicare Other

## 2017-06-28 ENCOUNTER — Encounter: Payer: Self-pay | Admitting: Medical Oncology

## 2017-06-28 ENCOUNTER — Encounter: Payer: Self-pay | Admitting: Radiation Oncology

## 2017-06-28 ENCOUNTER — Ambulatory Visit
Admission: RE | Admit: 2017-06-28 | Discharge: 2017-06-28 | Disposition: A | Discharge status: Home / Self Care | Payer: Medicare Other | Source: Ambulatory Visit | Attending: Radiation Oncology | Admitting: Radiation Oncology

## 2017-06-28 DIAGNOSIS — Z510 Encounter for antineoplastic radiation therapy: Secondary | ICD-10-CM | POA: Diagnosis not present

## 2017-06-29 NOTE — Progress Notes (Signed)
°  Radiation Oncology         [phone]) [phone] ________________________________  Name: Wyatt Austin MRN: [id number]  Date: 06/28/2017  DOB: [birth year]  End of Treatment Note  Diagnosis:   71 y.o. male with stage T1c adenocarcinoma of the prostate with Gleason Score of 4+4, and PSA of 12.7     Indication for treatment:  Curative, Definitive Radiotherapy       Radiation treatment dates:   04/28/2017 - 06/28/2017  Site/dose:   The prostate was treated to 78 Gy in 40 fractions of 1.95 Gy.  Beams/energy:   The patient was treated with IMRT using volumetric arc therapy delivering 6 MV X-rays to clockwise and counterclockwise circumferential arcs with a 90 degree collimator offset to avoid dose scalloping.  Image guidance was performed with daily cone beam CT prior to each fraction to align to gold markers in the prostate and assure proper bladder and rectal fill volumes.  Immobilization was achieved with BodyFix custom mold.  Narrative: The patient tolerated radiation treatment relatively well.  The patient experienced modest fatigue and minor urinary irritation. His primary complaint was nocturia x3-5. He denied dysuria, hematuria, urgency, or incontinence.  He also denied any pain or bowel changes.  Plan: The patient has completed radiation treatment. He will return to radiation oncology clinic for routine followup in one month. I advised him to call or return sooner if he has any questions or concerns related to his recovery or treatment. ________________________________  Sheral Apley. Tammi Klippel, M.D.  This document serves as a record of services personally performed by Tyler Pita, MD. It was created on his behalf by Rae Lips, a trained medical scribe. The creation of this record is based on the scribe's personal observations and the provider's statements to them. This document has been checked and approved by the attending provider.

## 2017-07-01 NOTE — Progress Notes (Signed)
Celebrated Mr. Wyatt Austin as he completed radiation treatments today. He is thrilled to be finished. He states he is so appreciative of the great care he has received. He has follow up with Ashlyn on October 9th. Asked him to call with questions or concerns.

## 2017-08-02 ENCOUNTER — Ambulatory Visit
Admission: RE | Admit: 2017-08-02 | Discharge: 2017-08-02 | Disposition: A | Discharge status: Home / Self Care | Payer: Medicare Other | Source: Ambulatory Visit | Attending: Radiation Oncology | Admitting: Radiation Oncology

## 2017-08-02 VITALS — BP 103/59 | HR 54 | Temp 98.0°F | Resp 18 | Ht 65.0 in | Wt 182.0 lb

## 2017-08-02 DIAGNOSIS — Z510 Encounter for antineoplastic radiation therapy: Secondary | ICD-10-CM | POA: Insufficient documentation

## 2017-08-02 DIAGNOSIS — C61 Malignant neoplasm of prostate: Secondary | ICD-10-CM

## 2017-08-02 NOTE — Progress Notes (Signed)
Radiation Oncology         [phone] ________________________________  Name: Wyatt Austin MRN: [id number]  Date: 08/02/2017  DOB: 04/04/1946  Post Treatment Note  CC: [name], MD  [name], MD  Diagnosis:   71 y.o. male with stage T1c adenocarcinoma of the prostate with Gleason Score of 4+4, and PSA of 12.7     Interval Since Last Radiation:  5 weeks  04/28/2017 - 06/28/2017:  The prostate was treated to 78 Gy in 40 fractions of 1.95 Gy.  Narrative:  The patient returns today for routine follow-up.  He tolerated radiation treatment relatively well.  He experienced modest fatigue and minor urinary irritation. His primary complaint was nocturia x3-5. He denied dysuria, hematuria, urgency, or incontinence.  He also denied any pain or bowel changes.                              On review of systems, the patient states that he is doing very well overall. He reports significant improvement in his lower urinary tracts symptoms and is really only bother with nocturia 3-4 per night at this point. He denies significant daytime frequency, urgency, incontinence, gross hematuria or dysuria. He continues with a week or urinary stream in the mornings but this improves throughout the day. Overall, he feels that he empties his bladder adequately on voiding. His current IPSS is 13 indicating moderate urinary symptoms. He reports a healthy appetite and is maintaining his weight. He denies abdominal pain, nausea, vomiting or diarrhea.  ALLERGIES:  is allergic to nifedipine.  Meds: Current Outpatient Prescriptions  Medication Sig Dispense Refill  . allopurinol (ZYLOPRIM) 100 MG tablet Take 100 mg by mouth.    . ALPRAZolam (XANAX) 0.25 MG tablet Take 0.25 mg by mouth.    Marland Kitchen amLODipine (NORVASC) 10 MG tablet Take 10 mg by mouth.    Marland Kitchen aspirin 81 MG chewable tablet Chew 81 mg by mouth.    . diphenhydrAMINE (BENADRYL) 25 MG tablet Take 25 mg by mouth.    . doxazosin (CARDURA) 8 MG  tablet Take 8 mg by mouth.    . finasteride (PROSCAR) 5 MG tablet Take 5 mg by mouth.    . furosemide (LASIX) 40 MG tablet Take 40 mg by mouth.    . magnesium oxide (MAG-OX) 400 MG tablet Take 400 mg by mouth.    . metoprolol (LOPRESSOR) 50 MG tablet Take 25 mg by mouth.    . Multiple Vitamin (MULTI-VITAMINS) TABS Take by mouth.    . mycophenolate (CELLCEPT) 250 MG capsule Take 500 mg by mouth.    . simvastatin (ZOCOR) 40 MG tablet Take 40 mg by mouth.    . tacrolimus (PROGRAF) 1 MG capsule Take 3 mg by mouth.    Marland Kitchen acetaminophen (TYLENOL) 500 MG tablet Take 500 mg by mouth.    . bismuth subsalicylate (PEPTO BISMOL) 262 MG chewable tablet Chew by mouth.    . docusate sodium (COLACE) 100 MG capsule Take 1 capsule (100 mg total) by mouth every 12 (twelve) hours. (Patient not taking: Reported on 08/02/2017) 60 capsule 0  . HYDROcodone-acetaminophen (NORCO/VICODIN) 5-325 MG tablet Take 2 tablets by mouth every 4 (four) hours as needed. (Patient not taking: Reported on 08/02/2017) 16 tablet 0  . promethazine (PHENERGAN) 12.5 MG tablet Take 12.5 mg by mouth every 6 (six) hours as needed for nausea or vomiting.    . sildenafil (VIAGRA) 100 MG tablet  Take 100 mg by mouth.     No current facility-administered medications for this encounter.     Physical Findings:  height is 5\' 5"  (1.651 m) and weight is 182 lb (82.6 kg). His oral temperature is 98 F (36.7 C). His blood pressure is 103/59 (abnormal) and his pulse is 54 (abnormal). His respiration is 18 and oxygen saturation is 98%.  Pain Assessment Pain Score: 0-No pain/10 In general this is a well appearing African-American male in no acute distress. He's alert and oriented x4 and appropriate throughout the examination. Cardiopulmonary assessment is negative for acute distress and he exhibits normal effort.   Lab Findings: No results found for: WBC, HGB, HCT, MCV, PLT   Radiographic Findings: No results found.  Impression/Plan: 1. 71 y.o. male  with stage T1c adenocarcinoma of the prostate with Gleason Score of 4+4, and PSA of 12.7.  He will continue to follow up with urology for ongoing PSA determinations and has an appointment scheduled with Dr. Dr. Alyson Ingles in approximately 2 weeks. He understands what to expect with regards to PSA monitoring going forward. I will look forward to following his response to treatment via correspondence with urology, and would be happy to continue to participate in his care if clinically indicated. I talked to the patient about what to expect in the future, including his risk for erectile dysfunction and rectal bleeding. I encouraged him to call or return to the office if he has any questions regarding his previous radiation or possible radiation side effects. He was comfortable with this plan and will follow up as needed.    Wyatt Johns, PA-C

## 2017-08-02 NOTE — Addendum Note (Signed)
Encounter addended by: Malena Edman, RN on: 08/02/2017  3:02 PM<BR>    Actions taken: Charge Capture section accepted

## 2017-08-19 IMAGING — US US RENAL TRANSPLANT
2 series · 13 of 25 positions shown · non-contrast
Comparison: None.

CLINICAL DATA: 69-year-old male with a history of right lower
quadrant renal transplantation and increasing creatinine

EXAM:
ULTRASOUND OF RENAL TRANSPLANT WITH RENAL DOPPLER ULTRASOUND
TECHNIQUE: Ultrasound examination of the renal transplant was performed with
gray-scale, color and duplex doppler evaluation.

[Series 1: us renal transplant · 0.30mm/px · 12 of 34 slices shown (1 of 2)]
[im 1/34]
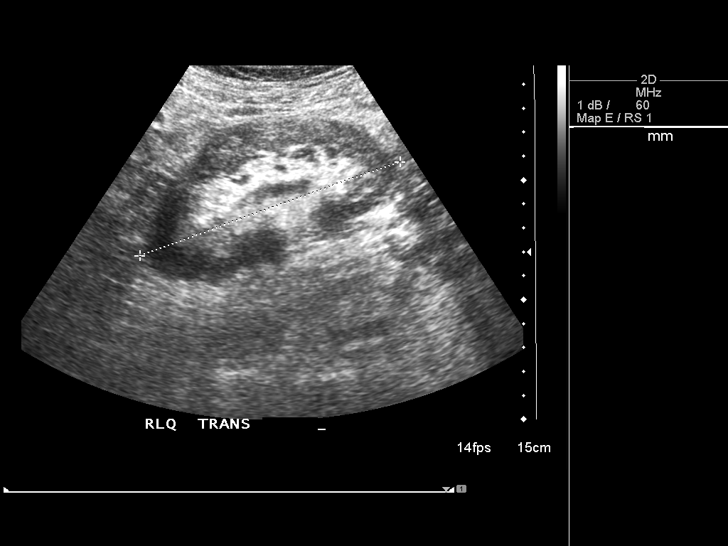
[im 4/34]
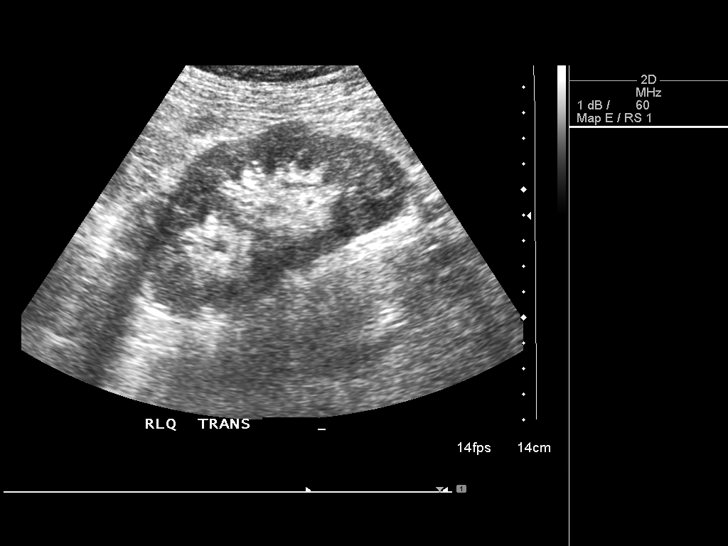
[im 7/34]
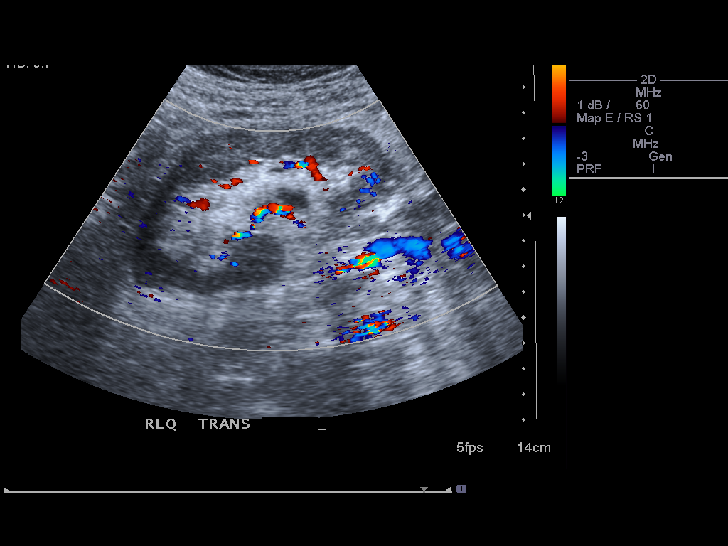
[im 10/34]
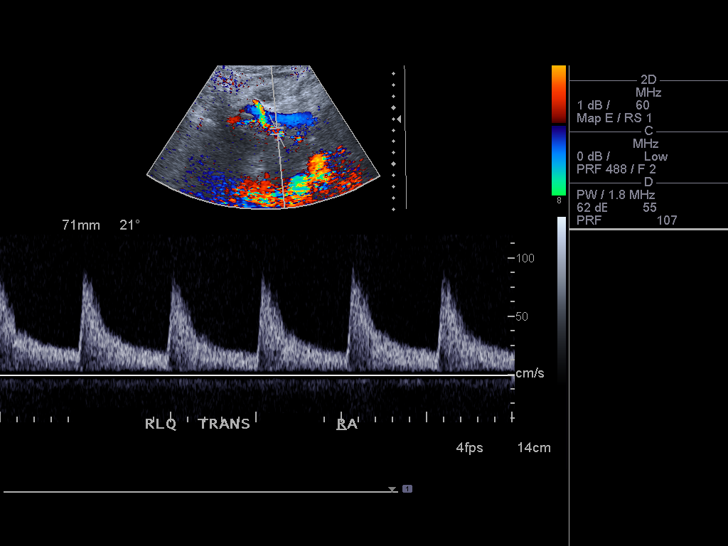
[im 13/34]
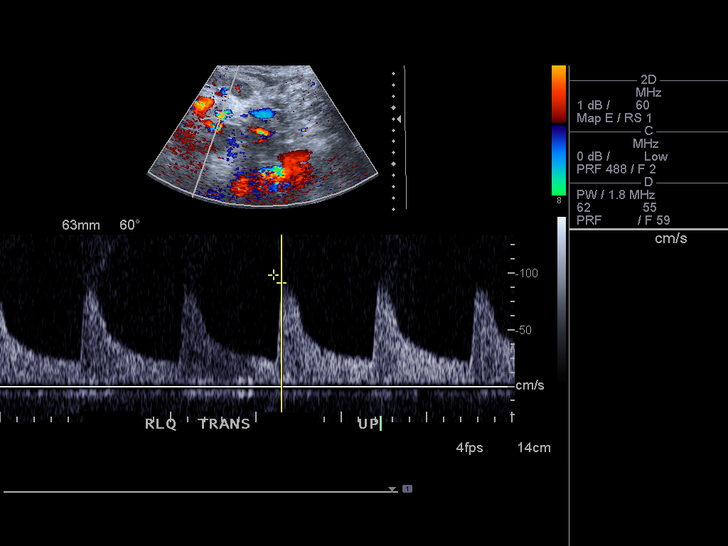
[im 16/34]
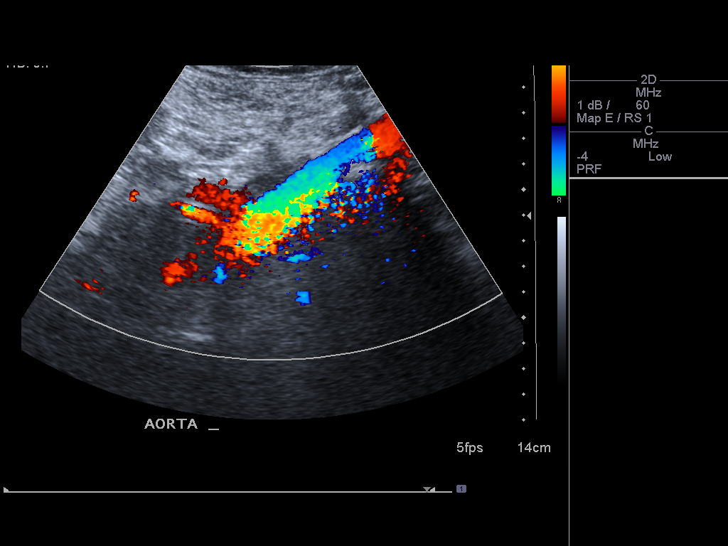
[im 19/34]
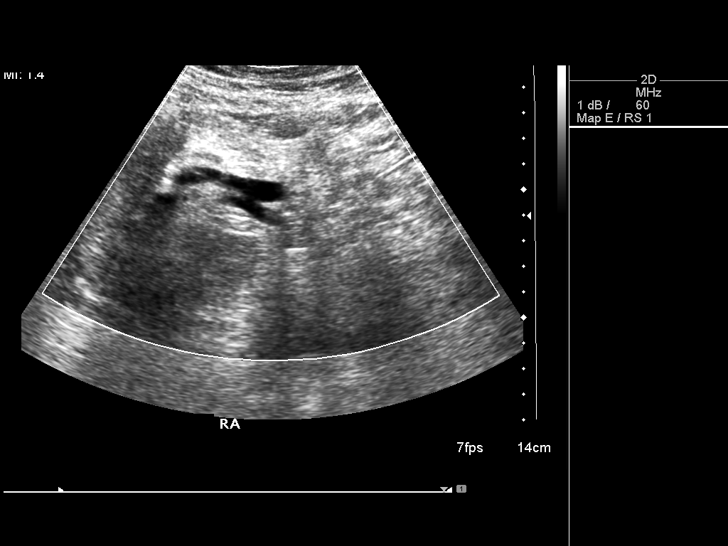
[im 22/34]
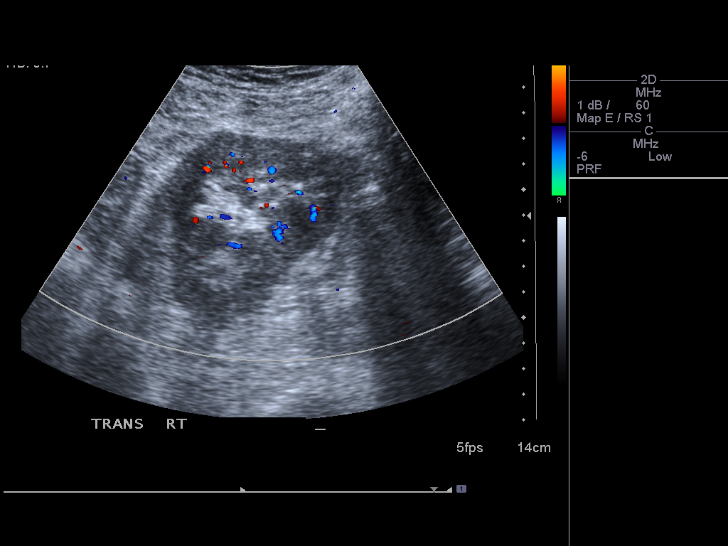
[im 25/34]
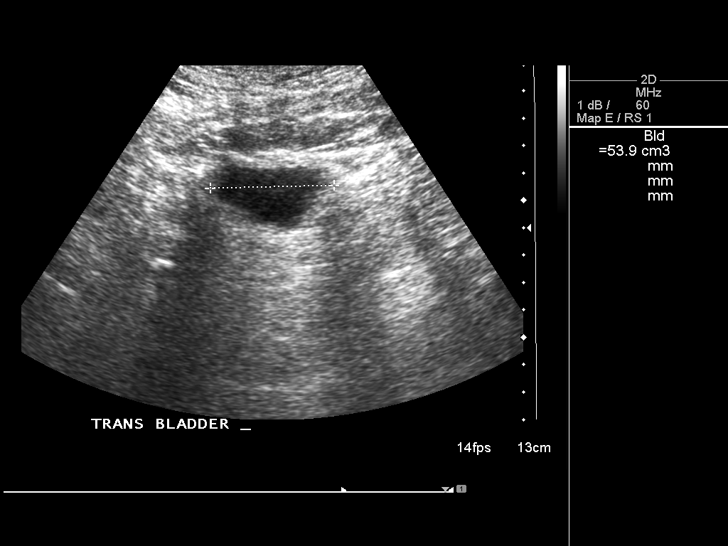
[im 28/34]
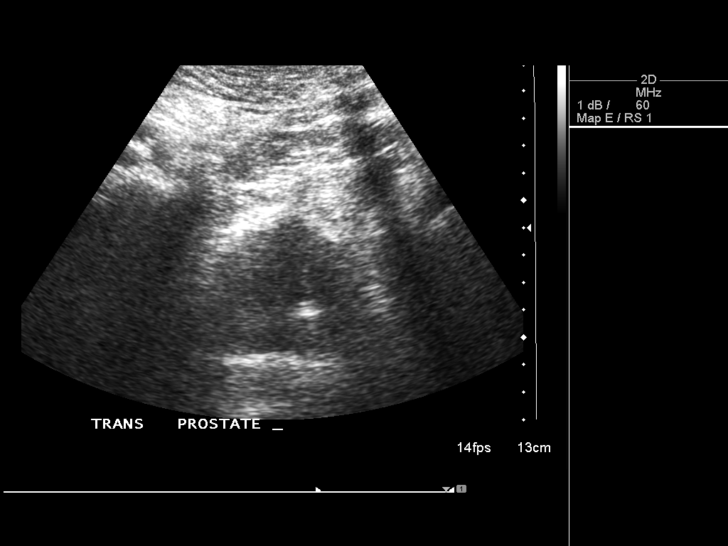
[im 31/34]
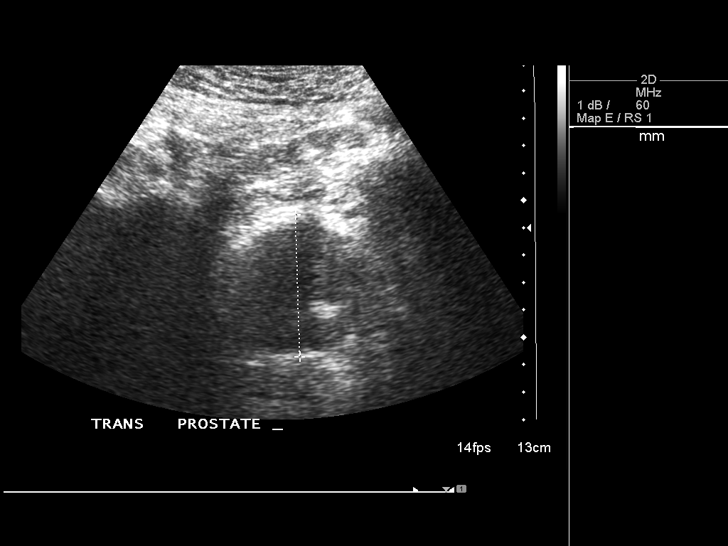
[im 34/34]
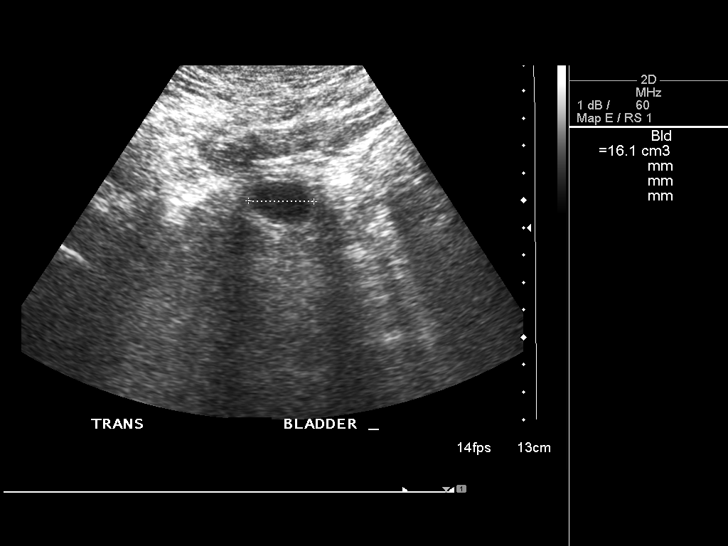

[Series 3: us renal transplant · 1 of 3 slices shown (2 of 2)]
[im 3/3]
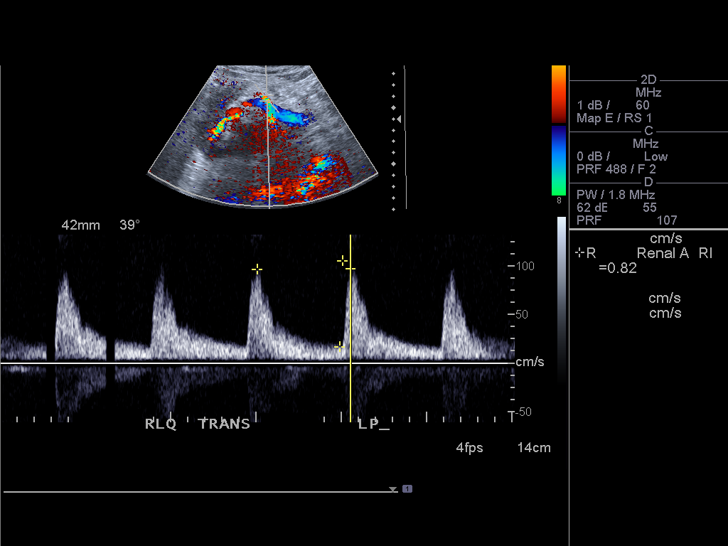

[13 of 25 positions shown; findings below may reference images not displayed]

FINDINGS: Transplant kidney location: RLQ

Transplant Kidney:

Length: 11.7 cm. Normal in size and parenchymal echogenicity. No
evidence of mass or hydronephrosis. No peri-transplant fluid
collection seen.

Color flow in the main renal artery:  Yes

Color flow in the main renal vein:  Yes

Duplex Doppler Evaluation:

Main Renal Artery Resistive Index:

Venous waveform in main renal vein:  Present

Intrarenal resistive index in upper pole:

(normal 0.6-0.8; equivocal 0.8-0.9; abnormal >= 0.9)

Intrarenal resistive index in lower pole:

(normal 0.6-0.8; equivocal 0.8-0.9; abnormal >= 0.9)

Bladder: Normal for degree of bladder distention.

Other findings:  No significant postvoid residual.
IMPRESSION: Normal sonographic evaluation of the right lower quadrant transplant
kidney.

## 2017-08-31 ENCOUNTER — Telehealth: Payer: Self-pay | Admitting: *Deleted

## 2017-08-31 NOTE — Telephone Encounter (Signed)
[facility] 08[facility]31-[facility], end of tx note, follow up notes

## 2018-03-20 ENCOUNTER — Other Ambulatory Visit: Payer: Self-pay

## 2018-03-20 ENCOUNTER — Encounter (HOSPITAL_COMMUNITY): Payer: Self-pay

## 2018-03-20 ENCOUNTER — Emergency Department (HOSPITAL_COMMUNITY)
Admission: EM | Admit: 2018-03-20 | Discharge: 2018-03-20 | Disposition: A | Discharge status: Home / Self Care | Payer: Medicare Other | Attending: Emergency Medicine | Admitting: Emergency Medicine

## 2018-03-20 DIAGNOSIS — R110 Nausea: Secondary | ICD-10-CM | POA: Insufficient documentation

## 2018-03-20 DIAGNOSIS — R197 Diarrhea, unspecified: Secondary | ICD-10-CM | POA: Insufficient documentation

## 2018-03-20 DIAGNOSIS — Z79899 Other long term (current) drug therapy: Secondary | ICD-10-CM | POA: Insufficient documentation

## 2018-03-20 DIAGNOSIS — Z8546 Personal history of malignant neoplasm of prostate: Secondary | ICD-10-CM | POA: Diagnosis not present

## 2018-03-20 DIAGNOSIS — R51 Headache: Secondary | ICD-10-CM | POA: Diagnosis present

## 2018-03-20 DIAGNOSIS — I10 Essential (primary) hypertension: Secondary | ICD-10-CM | POA: Diagnosis not present

## 2018-03-20 DIAGNOSIS — Z940 Kidney transplant status: Secondary | ICD-10-CM | POA: Diagnosis not present

## 2018-03-20 LAB — CBC WITH DIFFERENTIAL/PLATELET
ABS IMMATURE GRANULOCYTES: 0 10*3/uL (ref 0.0–0.1)
BASOS PCT: 0 %
Basophils Absolute: 0 10*3/uL (ref 0.0–0.1)
Eosinophils Absolute: 0 10*3/uL (ref 0.0–0.7)
Eosinophils Relative: 1 %
HCT: 40.6 % (ref 39.0–52.0)
Hemoglobin: 12.2 g/dL — ABNORMAL LOW (ref 13.0–17.0)
Immature Granulocytes: 0 %
Lymphocytes Relative: 12 %
Lymphs Abs: 0.6 10*3/uL — ABNORMAL LOW (ref 0.7–4.0)
MCH: 26.6 pg (ref 26.0–34.0)
MCHC: 30 g/dL (ref 30.0–36.0)
MCV: 88.6 fL (ref 78.0–100.0)
MONO ABS: 0.5 10*3/uL (ref 0.1–1.0)
MONOS PCT: 12 %
NEUTROS ABS: 3.5 10*3/uL (ref 1.7–7.7)
Neutrophils Relative %: 75 %
PLATELETS: 189 10*3/uL (ref 150–400)
RBC: 4.58 MIL/uL (ref 4.22–5.81)
RDW: 13.2 % (ref 11.5–15.5)
WBC: 4.6 10*3/uL (ref 4.0–10.5)

## 2018-03-20 LAB — COMPREHENSIVE METABOLIC PANEL
ALK PHOS: 49 U/L (ref 38–126)
ALT: 14 U/L — AB (ref 17–63)
AST: 17 U/L (ref 15–41)
Albumin: 4.2 g/dL (ref 3.5–5.0)
Anion gap: 7 (ref 5–15)
BUN: 18 mg/dL (ref 6–20)
CALCIUM: 9.5 mg/dL (ref 8.9–10.3)
CHLORIDE: 109 mmol/L (ref 101–111)
CO2: 26 mmol/L (ref 22–32)
CREATININE: 1.48 mg/dL — AB (ref 0.61–1.24)
GFR calc non Af Amer: 46 mL/min — ABNORMAL LOW (ref >60)
GFR, EST AFRICAN AMERICAN: 53 mL/min — AB (ref >60)
Glucose, Bld: 125 mg/dL — ABNORMAL HIGH (ref 65–99)
Potassium: 3.5 mmol/L (ref 3.5–5.1)
Sodium: 142 mmol/L (ref 135–145)
Total Bilirubin: 0.4 mg/dL (ref 0.3–1.2)
Total Protein: 7.1 g/dL (ref 6.5–8.1)

## 2018-03-20 MED ORDER — LACTATED RINGERS IV BOLUS
1000.0000 mL | Freq: Once | INTRAVENOUS | Status: AC
  Administered 2018-03-20: 1000 mL via INTRAVENOUS

## 2018-03-20 NOTE — ED Provider Notes (Addendum)
[facility] EMERGENCY DEPARTMENT Provider Note   CSN: [id number] Arrival date & time: 03/20/18  1837     History   Chief Complaint Chief Complaint  Patient presents with  . Toxic Inhalation    HPI Wyatt Austin is a 72 y.o. male.  HPI  Patient is a 72 year old male who comes in today in the setting of insecticide exposure.  Patient was cleaning a shed in his house when he tripped over a bottle of insecticide powder which broke.  Patient states that he initially inhaled for approximately half a second before turning his head and leaving the room.  Patient states that since that time he has had a headache with nausea as well as 2 episodes of diarrhea.  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  Patient denies any changes in bladder function, shortness of breath, chest pain, salivation, lacrimation or diaphoresis.  Past Medical History:  Diagnosis Date  . Gout   . Hyperlipidemia   . Hypertension   . Prostate cancer ([place])   . 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    Patient Active Problem List   Diagnosis Date Noted  . Malignant neoplasm of prostate ([place]) 01/17/2017    Past Surgical History:  Procedure Laterality Date  . KIDNEY TRANSPLANT    . PROSTATE BIOPSY    . SHOULDER SURGERY    . VASECTOMY  2009        Home Medications    Prior to Admission medications   Medication Sig Start Date End Date Taking? Authorizing Provider  acetaminophen (TYLENOL) 500 MG tablet Take 500 mg by mouth.    [provider]  allopurinol (ZYLOPRIM) 100 MG tablet Take 100 mg by mouth.    [provider]  ALPRAZolam Duanne Moron) 0.25 MG tablet Take 0.25 mg by mouth.    [provider]  amLODipine (NORVASC) 10 MG tablet Take 10 mg by mouth.    [provider]  aspirin 81 MG chewable tablet Chew 81 mg by mouth.    [provider]    bismuth subsalicylate (PEPTO BISMOL) 262 MG chewable tablet Chew by mouth.    [provider]  diphenhydrAMINE (BENADRYL) 25 MG tablet Take 25 mg by mouth.    [provider]  docusate sodium (COLACE) 100 MG capsule Take 1 capsule (100 mg total) by mouth every 12 (twelve) hours. Patient not taking: Reported on 08/02/2017 03/15/17   Fransico Meadow, PA-C  doxazosin (CARDURA) 8 MG tablet Take 8 mg by mouth.    [provider]  finasteride (PROSCAR) 5 MG tablet Take 5 mg by mouth.    [provider]  furosemide (LASIX) 40 MG tablet Take 40 mg by mouth.    [provider]  HYDROcodone-acetaminophen (NORCO/VICODIN) 5-325 MG tablet Take 2 tablets by mouth every 4 (four) hours as needed. Patient not taking: Reported on 08/02/2017 03/15/17   Fransico Meadow, PA-C  magnesium oxide (MAG-OX) 400 MG tablet Take 400 mg by mouth.    [provider]  metoprolol (LOPRESSOR) 50 MG tablet Take 25 mg by mouth.    [provider]  Multiple Vitamin (MULTI-VITAMINS) TABS Take by mouth.    [provider]  mycophenolate (CELLCEPT) 250 MG capsule Take 500 mg by mouth.    [provider]  promethazine (PHENERGAN) 12.5 MG tablet Take 12.5 mg by mouth every 6 (six)  hours as needed for nausea or vomiting.    [provider]  sildenafil (VIAGRA) 100 MG tablet Take 100 mg by mouth.    [provider]  simvastatin (ZOCOR) 40 MG tablet Take 40 mg by mouth.    [provider]  tacrolimus (PROGRAF) 1 MG capsule Take 3 mg by mouth.    [provider]    Family History Family History  Problem Relation Age of Onset  . Cancer Mother        "in her abdomen"  . Cancer Father        prostate "treatment unknown"    Social History Social History   Tobacco Use  . Smoking status: Never Smoker  . Smokeless tobacco: Never Used  Substance Use Topics  . Alcohol use: Yes    Comment: socially  . Drug use: No      Allergies   Nifedipine   Review of Systems Review of Systems  Constitutional: Negative for chills and fever.  HENT: Negative for drooling and rhinorrhea.   Respiratory: Negative for shortness of breath.   Cardiovascular: Negative for chest pain.  Gastrointestinal: Positive for diarrhea and nausea. Negative for vomiting.  Endocrine: Negative for polyuria.  Genitourinary: Negative for difficulty urinating and frequency.  Neurological: Positive for headaches.  All other systems reviewed and are negative.    Physical Exam Updated Vital Signs BP (!) 164/78   Pulse (!) 44   Temp 98.7 F (37.1 C) (Oral)   Resp 11   SpO2 99%   Physical Exam  Constitutional: He appears well-developed and well-nourished.  HENT:  Head: Normocephalic and atraumatic.  Mouth/Throat: Oropharynx is clear and moist.  Eyes: Pupils are equal, round, and reactive to light. Conjunctivae and EOM are normal. Right eye exhibits no discharge. Left eye exhibits no discharge.  Neck: Normal range of motion. Neck supple.  Cardiovascular: Normal rate and regular rhythm.  No murmur heard. Pulmonary/Chest: Effort normal and breath sounds normal. No respiratory distress. He has no wheezes. He has no rales.  Abdominal: Soft. There is no tenderness.  Musculoskeletal: He exhibits no edema.  Neurological: He is alert. No cranial nerve deficit.  Skin: Skin is warm and dry.  Psychiatric: He has a normal mood and affect.  Nursing note and vitals reviewed.    ED Treatments / Results  Labs (all labs ordered are listed, but only abnormal results are displayed) Labs Reviewed  CBC WITH DIFFERENTIAL/PLATELET - Abnormal; Notable for the following components:      Result Value   Hemoglobin 12.2 (*)    Lymphs Abs 0.6 (*)    All other components within normal limits  COMPREHENSIVE METABOLIC PANEL - Abnormal; Notable for the following components:   Glucose, Bld 125 (*)    Creatinine, Ser 1.48 (*)    ALT 14 (*)    GFR  calc non Af Amer 46 (*)    GFR calc Af Amer 53 (*)    All other components within normal limits  ACETYLCHOLINE RECEPTOR, BINDING    EKG None  Radiology No results found.  Procedures Procedures (including critical care time)  Medications Ordered in ED Medications  lactated ringers bolus 1,000 mL (0 mLs Intravenous Stopped 03/20/18 2136)     Initial Impression / Assessment and Plan / ED Course  I have reviewed the triage vital signs and the nursing notes.  Pertinent labs & imaging results that were available during my care of the patient were reviewed by me and considered in my medical  decision making (see chart for details).  Clinical Course as of Mar 26 2056  Mon Mar 20, 2018  1920 Spoke to poison control.  They recommend cardiac monitoring for now until toxicologist calls back.   [HK]  [place] control pharmacist spoke to toxicologist who recommends that patient should be monitored until symptoms resolve. They rec observing for excess salivation, respiratory issues, lacrimation. However, do not recommend any medications at this point. Should monitor patient until he feels his symptoms have resolved. Can call Suezanne Jacquet, pharmacist, at [place] control if any further questions.   [HK]    Clinical Course User Index [HK] Delia Heady, PA-C    Poison control was contacted who recommended cardiac monitoring but no further intervention at this time.  Patient was observed for the appropriate amount of time and symptoms resolved.  Patient has had no further episodes of nausea or diarrhea.  Patient has not vomited.  Patient denies shortness of breath or change in urination.  Laboratory largely within normal limits without concerning findings.  Patient with stable vital signs throughout time in the emergency department without episodes of bradycardia or hypotension.  Patient clear to auscultation bilaterally and has normal oxygen saturation. Pt given appropriate f/u and return precautions. Pt  voiced understanding and is agreeable to discharge at this time.   Final Clinical Impressions(s) / ED Diagnoses   Final diagnoses:  Nausea  Diarrhea, unspecified type    ED Discharge Orders    None       Chapman Moss, MD 03/21/18 1557    Chapman Moss, MD 03/21/18 1423    Chapman Moss, MD 03/21/18 1601    Chapman Moss, MD 03/26/18 2057    Tanna Furry, MD 03/30/18 1148

## 2018-03-20 NOTE — ED Provider Notes (Cosign Needed)
Patient placed in Quick Look pathway, seen and evaluated   Chief Complaint: inhalation  HPI:   Patient presents for possible insecticide inhalation.  Patient was cleaning out the shed in his house when he accidentally tipped over a 73 year old bottle of insecticide dust. The bottle broke.  He inhaled the dust for about 1/2 second before immediately turning his head to the left side.  This happened outside. 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  He is unable to tell me the name or contents of the insecticide because the bottle is 73 years old and has been sitting in his shed for the past 10 years.  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  He reports headache, nausea. 2 episodes of diarrhea today.  He reports poisoning in the past for which he needed atropine.  He denies any loss of bowel or bladder function, shortness of breath, chest pain, lacrimation, salivation, prolonged recent exposure.  ROS: headache, nausea, diarrhea  Physical Exam:   Gen: No distress  Neuro: Awake and Alert  Skin: Warm    Focused Exam: RRR, lungs CTAB. No excess salivation or urinary incontinence noted.  Clinical Course as of Mar 20 1924  Mon Mar 20, 2018  1920 Spoke to poison control.  They recommend cardiac monitoring for now until toxicologist calls back.   [HK]    Clinical Course User Index [HK] Shelly Coss, Ramiyah Mcclenahan, PA-C   I contacted poison control about patient, comorbidities and medications.  Unable to identify name or contents of insecticide dust, because bottle is old and not labeled. However, patient not tachycardic, hypoxic does not appear in respiratory distress. I informed RN that patient will need to be on cardiac monitor per poison control recommendations.  Poison control will contact me after speaking to toxicologist.  Initiation of care has begun. The patient has been counseled on the process, plan, and necessity for staying for the  completion/evaluation, and the remainder of the medical screening examination    Delia Heady, PA-C 03/20/18 1928

## 2018-03-20 NOTE — Discharge Instructions (Addendum)
Check here with any new or worsening symptoms

## 2018-03-20 NOTE — ED Provider Notes (Signed)
Patient seen and evaluated.  Is currently asymptomatic.  No nausea or diarrhea for more than 4 hours.  Does not have spasm.  No bronchorrhea.  Eyes are normal.  Not like urinating.  No nasal drainage.  Not salivating.  Urinating without difficulty or change.  Occasions for any interventions at this point.  Discussed with poison control   Tanna Furry, MD 03/20/18 2135

## 2018-03-20 NOTE — ED Triage Notes (Signed)
Pt presents for evaluation of possible inhalation of insecticide. Pt reports it was earlier this morning, he was in his shed and had a container with powder insect killer that broke. He said the dust was in the air. A few hours later pt began to have headache, nausea and diarrhea.

## 2018-03-21 LAB — ACETYLCHOLINE RECEPTOR, BINDING: Acety choline binding ab: <0.03 nmol/L (ref 0.00–0.24)

## 2018-04-25 IMAGING — NM NM BONE WHOLE BODY
2 series · 2 of 2 positions shown · non-contrast
Comparison: Total body bone scan of 09/04/2008

CLINICAL DATA: History of prostate carcinoma evaluate for bone
metastasis

EXAM:
NUCLEAR MEDICINE WHOLE BODY BONE SCAN
TECHNIQUE: Whole body anterior and posterior images were obtained approximately
3 hours after intravenous injection of radiopharmaceutical.
RADIOPHARMACEUTICALS:  21.2 mCi Oechnetium-33m MDP IV

[Series 1: whole body · 2.66mm/px · 1 of 1 slices shown (1 of 2)]
[im 1/1]
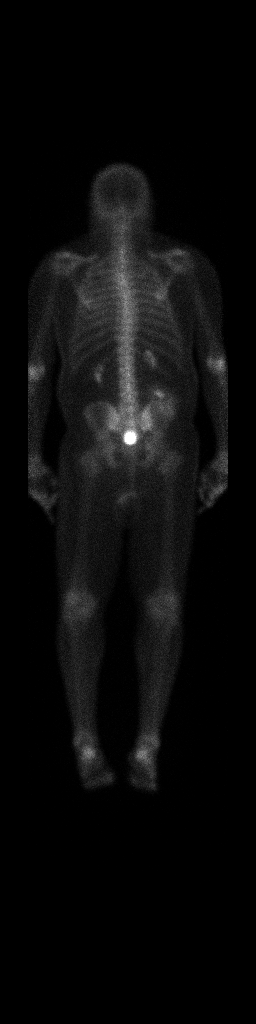

[Series 1: whole body · 2.66mm/px · 1 of 1 slices shown (2 of 2)]
[im 1/1]
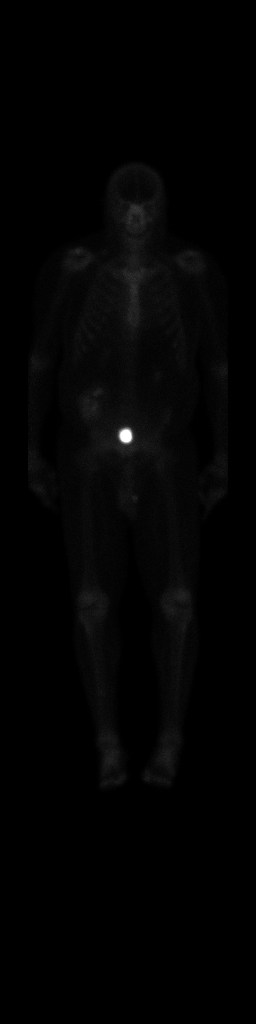

[2 of 2 positions shown; findings below may reference images not displayed]

FINDINGS: There are mild degenerative changes in the knees and ankles. However
no evidence of bone metastases is seen.
IMPRESSION: No evidence of bone metastases. Findings consistent were
degenerative changes in the knees and ankles.

## 2023-10-11 ENCOUNTER — Other Ambulatory Visit (HOSPITAL_COMMUNITY): Payer: Self-pay | Admitting: Pulmonary Disease

## 2023-10-11 ENCOUNTER — Ambulatory Visit (HOSPITAL_COMMUNITY)
Admission: RE | Admit: 2023-10-11 | Discharge: 2023-10-11 | Disposition: A | Discharge status: Home / Self Care | Payer: Medicare Other | Source: Ambulatory Visit | Attending: Pulmonary Disease | Admitting: Pulmonary Disease

## 2023-10-11 DIAGNOSIS — R059 Cough, unspecified: Secondary | ICD-10-CM | POA: Insufficient documentation

## 2024-05-01 ENCOUNTER — Ambulatory Visit: Payer: Self-pay | Admitting: Podiatry

## 2024-05-01 DIAGNOSIS — M79672 Pain in left foot: Secondary | ICD-10-CM

## 2024-05-01 DIAGNOSIS — M79671 Pain in right foot: Secondary | ICD-10-CM

## 2024-05-01 DIAGNOSIS — B351 Tinea unguium: Secondary | ICD-10-CM | POA: Diagnosis not present

## 2024-05-01 NOTE — Progress Notes (Signed)
 Patient presents for evaluation and treatment of tenderness and some redness around nails feet.  Tenderness around toes with walking and wearing shoes.  Physical exam:  General appearance: Alert, pleasant, and in no acute distress.  Vascular: Pedal pulses: DP 2/4 B/L, PT 0/4 B/L.  Moderate edema lower legs bilaterally  Neurological:    Dermatologic:  Nails thickened, disfigured, discolored 1-5 BL with subungual debris.  Redness and hypertrophic nail folds along nail folds bilaterally but no signs of drainage or infection.  Skin thin and atrophic with no hair growth on the lower extremity  Musculoskeletal:  Hammertoes 2 through 5 bilaterally   Diagnosis: 1. Painful onychomycotic nails 1 through 5 bilaterally. 2. Pain toes 1 through 5 bilaterally.  Plan: Debrided onychomycotic nails 1 through 5 bilaterally.  Return 3 months RFC

## 2024-07-31 ENCOUNTER — Ambulatory Visit: Admitting: Podiatry

## 2024-07-31 DIAGNOSIS — M79672 Pain in left foot: Secondary | ICD-10-CM | POA: Diagnosis not present

## 2024-07-31 DIAGNOSIS — B351 Tinea unguium: Secondary | ICD-10-CM | POA: Diagnosis not present

## 2024-07-31 DIAGNOSIS — M79671 Pain in right foot: Secondary | ICD-10-CM

## 2024-07-31 NOTE — Progress Notes (Signed)
 Patient presents for evaluation and treatment of tenderness and some redness around nails feet.  Tenderness around toes with walking and wearing shoes.  Physical exam:  General appearance: Alert, pleasant, and in no acute distress.  Vascular: Pedal pulses: DP 2/4 B/L, PT 0/4 B/L. Moderate edema lower legs bilaterally  Neu  Dermatologic:  Nails thickened, disfigured, discolored 1-5 BL with subungual debris.  Redness and hypertrophic nail folds along nail folds bilaterally but no signs of drainage or infection.  Musculoskeletal:     Diagnosis: 1. Painful onychomycotic nails 1 through 5 bilaterally. 2. Pain toes 1 through 5 bilaterally.  Plan: -Debrided onychomycotic nails 1 through 5 bilaterally.  Sharply debrided nails with nail clipper and reduced with a power bur.  Return 3 months RFC

## 2024-10-31 ENCOUNTER — Ambulatory Visit: Admitting: Podiatry

## 2024-12-11 ENCOUNTER — Ambulatory Visit: Admitting: Podiatry
